# Patient Record
Sex: Female | Born: 1965 | Race: White | Hispanic: No | Marital: Married | Smoking: Former smoker
Health system: Southern US, Community
[De-identification: ages and names within clinical notes are randomized; demographics above are authoritative.]

## PROBLEM LIST (undated history)

## (undated) DIAGNOSIS — F329 Major depressive disorder, single episode, unspecified: Secondary | ICD-10-CM

## (undated) DIAGNOSIS — Z9889 Other specified postprocedural states: Secondary | ICD-10-CM

## (undated) DIAGNOSIS — T4145XA Adverse effect of unspecified anesthetic, initial encounter: Secondary | ICD-10-CM

## (undated) DIAGNOSIS — F32A Depression, unspecified: Secondary | ICD-10-CM

## (undated) DIAGNOSIS — R112 Nausea with vomiting, unspecified: Secondary | ICD-10-CM

## (undated) DIAGNOSIS — T8859XA Other complications of anesthesia, initial encounter: Secondary | ICD-10-CM

## (undated) HISTORY — PX: DIAGNOSTIC LAPAROSCOPY: SUR761

---

## 1987-10-03 HISTORY — PX: WISDOM TOOTH EXTRACTION: SHX21

## 2004-12-08 ENCOUNTER — Ambulatory Visit: Payer: Self-pay

## 2006-11-08 ENCOUNTER — Ambulatory Visit: Payer: Self-pay

## 2007-12-10 ENCOUNTER — Ambulatory Visit: Payer: Self-pay

## 2008-12-14 ENCOUNTER — Ambulatory Visit: Payer: Self-pay

## 2009-12-16 ENCOUNTER — Ambulatory Visit: Payer: Self-pay

## 2010-12-19 ENCOUNTER — Ambulatory Visit: Payer: Self-pay

## 2012-01-04 ENCOUNTER — Ambulatory Visit: Payer: Self-pay

## 2013-01-06 ENCOUNTER — Ambulatory Visit: Payer: Self-pay

## 2015-11-10 NOTE — H&P (Signed)
Erica Cortez is a 50 y.o. female here for Pre Op Consulting  AUB- 2 months of heavy daily bleeding with significant cramping. Not interested in hormonal control or ablation at this time. No EMBx completed.  Normal ultrasound with 13mm stripe.   Past Medical History:  has a past medical history of Depression, unspecified.  Past Surgical History:  has no past surgical history on file. Family History: family history includes Diabetes mellitus in her mother; Heart disease in her father. Social History:  reports that she has never smoked. She does not have any smokeless tobacco history on file. She reports that she does not drink alcohol. OB/GYN History:  OB History    Gravida Para Term Preterm AB TAB SAB Ectopic Multiple Living   Allergies: has No Known Allergies. Medications:  Current Outpatient Prescriptions:  . sertraline (ZOLOFT) 50 MG tablet, Take 1 tablet (50 mg total) by mouth once daily., Disp: 30 tablet, Rfl: 12   Review of Systems: General:   No fatigue or weight loss Eyes:   No vision changes Ears:   No hearing difficulty Respiratory:   No cough or shortness of breath Pulmonary:   No asthma or shortness of breath Cardiovascular:  No chest pain, palpitations, dyspnea on exertion Gastrointestinal:  No abdominal bloating, chronic diarrhea, constipations, masses, pain or hematochezia Genitourinary:  No hematuria, dysuria, abnormal vaginal discharge, pelvic pain, +Menometrorrhagia  Lymphatic:  No swollen lymph nodes Musculoskeletal: No muscle weakness Neurologic:  No extremity weakness, syncope, seizure disorder Psychiatric:  No history of depression, delusions or suicidal/homicidal ideation   Exam:      Vitals:   11/10/15 1013  BP: 113/69  Pulse: 97   Body mass index is 25.44 kg/(m^2).  WDWN white female in NAD Lungs: CTA  CV : RRR without murmur  Breast: deferred Neck: no thyromegaly Abdomen: soft , no mass, normal  active bowel sounds, non-tender, no rebound tenderness Skin: No rashes, ulcers or skin lesions noted. No excessive hirsutism or acne noted.  Neurological: Appears alert and oriented and is a good historian. No gross abnormalities are noted. Psychological: Normal affect and mood. No signs of anxiety or depression noted.   Pelvic:  External genitalia: vulva /labia no lesions, Tanner stage 5 Urethra: no prolapse Vagina: normal physiologic d/c Cervix: no lesions, no cervical motion tenderness  Uterus: normal size shape and contour, non-tender Adnexa: no mass, non-tender  Rectovaginal: external exam normal  Endometrial biopsy: The cervix was cleaned with betadine, topical Hurriciane spray applied, and a single tooth tenaculum is applied to the anterior cervix. The Pipelle catheter was placed into the endometrial cavity. It sounds to 7 cm and adequate tissue was removed.    Impression:   The encounter diagnosis was Abnormal uterine bleeding (AUB).    Plan:   Abnormal uterine bleeding with thickened endometrial stripe:   Preoperative visit: D&C hysteroscopy. Consents signed today. Risks of surgery were discussed with the patient including but not limited to: bleeding which may require transfusion; infection which may require antibiotics; injury to uterus or surrounding organs; intrauterine scarring which may impair future fertility; need for additional procedures including laparotomy or laparoscopy; and other postoperative/anesthesia complications. Written informed consent was obtained.  This is a scheduled same-day surgery. She will have a postop visit in 2 weeks to review operative findings and pathology.  She is aware that if uterine abnormalities found may need additional procedures.  25 min spent with  the patient, with >50% spent in counseling and clinical decision making

## 2015-11-11 ENCOUNTER — Encounter
Admission: RE | Admit: 2015-11-11 | Discharge: 2015-11-11 | Disposition: A | Discharge status: Home / Self Care | Payer: 59 | Source: Ambulatory Visit | Attending: Obstetrics and Gynecology | Admitting: Obstetrics and Gynecology

## 2015-11-11 DIAGNOSIS — Z01812 Encounter for preprocedural laboratory examination: Secondary | ICD-10-CM | POA: Insufficient documentation

## 2015-11-11 HISTORY — DX: Other specified postprocedural states: Z98.890

## 2015-11-11 HISTORY — DX: Nausea with vomiting, unspecified: R11.2

## 2015-11-11 HISTORY — DX: Depression, unspecified: F32.A

## 2015-11-11 HISTORY — DX: Major depressive disorder, single episode, unspecified: F32.9

## 2015-11-11 HISTORY — DX: Other complications of anesthesia, initial encounter: T88.59XA

## 2015-11-11 HISTORY — DX: Adverse effect of unspecified anesthetic, initial encounter: T41.45XA

## 2015-11-11 LAB — CBC
HEMATOCRIT: 36.8 % (ref 35.0–47.0)
HEMOGLOBIN: 12.4 g/dL (ref 12.0–16.0)
MCH: 30.6 pg (ref 26.0–34.0)
MCHC: 33.6 g/dL (ref 32.0–36.0)
MCV: 91 fL (ref 80.0–100.0)
Platelets: 299 10*3/uL (ref 150–440)
RBC: 4.04 MIL/uL (ref 3.80–5.20)
RDW: 13.7 % (ref 11.5–14.5)
WBC: 8.1 10*3/uL (ref 3.6–11.0)

## 2015-11-11 LAB — BASIC METABOLIC PANEL
Anion gap: 4 — ABNORMAL LOW (ref 5–15)
BUN: 13 mg/dL (ref 6–20)
CHLORIDE: 108 mmol/L (ref 101–111)
CO2: 27 mmol/L (ref 22–32)
Calcium: 8.8 mg/dL — ABNORMAL LOW (ref 8.9–10.3)
Creatinine, Ser: 0.67 mg/dL (ref 0.44–1.00)
GFR calc non Af Amer: >60 mL/min (ref >60)
Glucose, Bld: 85 mg/dL (ref 65–99)
Potassium: 3.6 mmol/L (ref 3.5–5.1)
SODIUM: 139 mmol/L (ref 135–145)

## 2015-11-11 LAB — TYPE AND SCREEN
ABO/RH(D): A POS
Antibody Screen: NEGATIVE

## 2015-11-11 LAB — ABO/RH: ABO/RH(D): A POS

## 2015-11-11 NOTE — Patient Instructions (Signed)
  Your procedure is scheduled on: Monday Feb. 13, 2016. Report to Same Day Surgery. To find out your arrival time please call [phone] between 1PM - 3PM on Friday Feb. 10, 2017.  Remember: Instructions that are not followed completely may result in serious medical risk, up to and including death, or upon the discretion of your surgeon and anesthesiologist your surgery may need to be rescheduled.    _x___ 1. Do not eat food or drink liquids after midnight. No gum chewing or hard candies.     ____ 2. No Alcohol for 24 hours before or after surgery.   ____ 3. Bring all medications with you on the day of surgery if instructed.    __x__ 4. Notify your doctor if there is any change in your medical condition     (cold, fever, infections).     Do not wear jewelry, make-up, hairpins, clips or nail polish.  Do not wear lotions, powders, or perfumes. You may wear deodorant.  Do not shave 48 hours prior to surgery. Men may shave face and neck.  Do not bring valuables to the hospital.    [facility] is not responsible for any belongings or valuables.               Contacts, dentures or bridgework may not be worn into surgery.  Leave your suitcase in the car. After surgery it may be brought to your room.  For patients admitted to the hospital, discharge time is determined by your treatment team.   Patients discharged the day of surgery will not be allowed to drive home.    Please read over the following fact sheets that you were given:   [facility] Preparing for Surgery  __x__ Take these medicines the morning of surgery with A SIP OF WATER:    1. sertraline (ZOLOFT)     ____ Fleet Enema (as directed)   ____ Use CHG Soap as directed  ____ Use inhalers on the day of surgery  ____ Stop metformin 2 days prior to surgery    ____ Take 1/2 of usual insulin dose the night before surgery and none on the morning of surgery.   ____ Stop Coumadin/Plavix/aspirin on does not apply.  __x__  Stop Anti-inflammatories now.  Tylenol is ok for pain   ____ Stop supplements until after surgery.    ____ Bring C-Pap to the hospital.

## 2015-11-15 ENCOUNTER — Ambulatory Visit
Admission: RE | Admit: 2015-11-15 | Discharge: 2015-11-15 | Disposition: A | Discharge status: Home / Self Care | Payer: Commercial Managed Care - HMO | Source: Ambulatory Visit | Attending: Obstetrics and Gynecology | Admitting: Obstetrics and Gynecology

## 2015-11-15 ENCOUNTER — Ambulatory Visit: Payer: Commercial Managed Care - HMO | Admitting: Anesthesiology

## 2015-11-15 ENCOUNTER — Encounter
Admission: RE | Disposition: A | Discharge status: Home / Self Care | Payer: Self-pay | Source: Ambulatory Visit | Attending: Obstetrics and Gynecology

## 2015-11-15 DIAGNOSIS — Z79899 Other long term (current) drug therapy: Secondary | ICD-10-CM | POA: Insufficient documentation

## 2015-11-15 DIAGNOSIS — N840 Polyp of corpus uteri: Secondary | ICD-10-CM | POA: Insufficient documentation

## 2015-11-15 DIAGNOSIS — Z8249 Family history of ischemic heart disease and other diseases of the circulatory system: Secondary | ICD-10-CM | POA: Insufficient documentation

## 2015-11-15 DIAGNOSIS — N879 Dysplasia of cervix uteri, unspecified: Secondary | ICD-10-CM | POA: Insufficient documentation

## 2015-11-15 DIAGNOSIS — N8500 Endometrial hyperplasia, unspecified: Secondary | ICD-10-CM | POA: Diagnosis not present

## 2015-11-15 DIAGNOSIS — Z833 Family history of diabetes mellitus: Secondary | ICD-10-CM | POA: Insufficient documentation

## 2015-11-15 DIAGNOSIS — N939 Abnormal uterine and vaginal bleeding, unspecified: Secondary | ICD-10-CM | POA: Insufficient documentation

## 2015-11-15 DIAGNOSIS — F329 Major depressive disorder, single episode, unspecified: Secondary | ICD-10-CM | POA: Insufficient documentation

## 2015-11-15 HISTORY — PX: HYSTEROSCOPY WITH D & C: SHX1775

## 2015-11-15 LAB — POCT PREGNANCY, URINE: PREG TEST UR: NEGATIVE

## 2015-11-15 SURGERY — DILATATION AND CURETTAGE /HYSTEROSCOPY
Anesthesia: General | Site: Vagina | Wound class: Clean Contaminated

## 2015-11-15 MED ORDER — DOCUSATE SODIUM 100 MG PO CAPS: 100.0000 mg | ORAL_CAPSULE | Freq: Two times a day (BID) | ORAL | Status: DC | PRN

## 2015-11-15 MED ORDER — DEXAMETHASONE SODIUM PHOSPHATE 10 MG/ML IJ SOLN
INTRAMUSCULAR | Status: DC | PRN
  Administered 2015-11-15: 10 mg via INTRAVENOUS

## 2015-11-15 MED ORDER — LACTATED RINGERS IV SOLN
INTRAVENOUS | Status: DC
  Administered 2015-11-15: 13:00:00 via INTRAVENOUS

## 2015-11-15 MED ORDER — MIDAZOLAM HCL 2 MG/2ML IJ SOLN
INTRAMUSCULAR | Status: DC | PRN
  Administered 2015-11-15: 2 mg via INTRAVENOUS

## 2015-11-15 MED ORDER — FAMOTIDINE 20 MG PO TABS
20.0000 mg | ORAL_TABLET | Freq: Once | ORAL | Status: AC
  Administered 2015-11-15: 20 mg via ORAL

## 2015-11-15 MED ORDER — SILVER NITRATE-POT NITRATE 75-25 % EX MISC
CUTANEOUS | Status: AC
  Filled 2015-11-15: qty 4

## 2015-11-15 MED ORDER — IBUPROFEN 600 MG PO TABS
600.0000 mg | ORAL_TABLET | Freq: Four times a day (QID) | ORAL | Status: DC | PRN
Start: 2015-11-15 — End: 2020-10-18

## 2015-11-15 MED ORDER — PROPOFOL 10 MG/ML IV BOLUS: INTRAVENOUS | Status: DC | PRN

## 2015-11-15 MED ORDER — FAMOTIDINE 20 MG PO TABS
ORAL_TABLET | ORAL | Status: AC
  Administered 2015-11-15: 20 mg via ORAL
  Filled 2015-11-15: qty 1

## 2015-11-15 MED ORDER — LACTATED RINGERS IV SOLN
INTRAVENOUS | Status: DC
  Administered 2015-11-15: 10:00:00 via INTRAVENOUS

## 2015-11-15 MED ORDER — LIDOCAINE HCL (CARDIAC) 20 MG/ML IV SOLN
INTRAVENOUS | Status: DC | PRN
  Administered 2015-11-15: 40 mg via INTRAVENOUS

## 2015-11-15 MED ORDER — ONDANSETRON HCL 4 MG/2ML IJ SOLN
4.0000 mg | Freq: Once | INTRAMUSCULAR | Status: AC | PRN
  Administered 2015-11-15: 4 mg via INTRAVENOUS

## 2015-11-15 MED ORDER — FENTANYL CITRATE (PF) 100 MCG/2ML IJ SOLN
INTRAMUSCULAR | Status: AC
  Administered 2015-11-15: 25 ug via INTRAVENOUS
  Filled 2015-11-15: qty 2

## 2015-11-15 MED ORDER — FENTANYL CITRATE (PF) 100 MCG/2ML IJ SOLN
INTRAMUSCULAR | Status: DC | PRN
  Administered 2015-11-15: 50 ug via INTRAVENOUS

## 2015-11-15 MED ORDER — PROPOFOL 10 MG/ML IV BOLUS
INTRAVENOUS | Status: DC | PRN
  Administered 2015-11-15: 150 mg via INTRAVENOUS

## 2015-11-15 MED ORDER — FENTANYL CITRATE (PF) 100 MCG/2ML IJ SOLN
25.0000 ug | INTRAMUSCULAR | Status: DC | PRN
  Administered 2015-11-15 (×4): 25 ug via INTRAVENOUS

## 2015-11-15 SURGICAL SUPPLY — 17 items
CANISTER SUCT 3000ML (MISCELLANEOUS) ×2 IMPLANT
CATH ROBINSON RED A/P 16FR (CATHETERS) ×2 IMPLANT
CORD URO TURP 10FT (MISCELLANEOUS) IMPLANT
ELECT LOOP MED HF 24F 12D (CUTTING LOOP) IMPLANT
ELECT REM PT RETURN 9FT ADLT (ELECTROSURGICAL)
ELECT RESECT POWERBALL 24F (MISCELLANEOUS) IMPLANT
ELECTRODE REM PT RTRN 9FT ADLT (ELECTROSURGICAL) IMPLANT
GLOVE BIO SURGEON STRL SZ 6.5 (GLOVE) ×2 IMPLANT
GLOVE INDICATOR 7.0 STRL GRN (GLOVE) ×2 IMPLANT
GOWN STRL REUS W/ TWL LRG LVL3 (GOWN DISPOSABLE) ×2 IMPLANT
GOWN STRL REUS W/TWL LRG LVL3 (GOWN DISPOSABLE) ×2
IV LACTATED RINGERS 1000ML (IV SOLUTION) ×2 IMPLANT
KIT RM TURNOVER CYSTO AR (KITS) ×2 IMPLANT
PACK DNC HYST (MISCELLANEOUS) ×2 IMPLANT
PAD OB MATERNITY 4.3X12.25 (PERSONAL CARE ITEMS) ×2 IMPLANT
PAD PREP 24X41 OB/GYN DISP (PERSONAL CARE ITEMS) ×2 IMPLANT
TUBING CONNECTING 10 (TUBING) ×2 IMPLANT

## 2015-11-15 NOTE — Anesthesia Preprocedure Evaluation (Addendum)
Anesthesia Evaluation  Patient identified by MRN, date of birth, ID band Patient awake    Reviewed: Allergy & Precautions, H&P , NPO status , Patient's Chart, lab work & pertinent test results, reviewed documented beta blocker date and time   History of Anesthesia Complications (+) PONV and history of anesthetic complications  Airway Mallampati: II  TM Distance: >3 FB Neck ROM: full    Dental  (+) Teeth Intact,    Pulmonary neg pulmonary ROS, former smoker,    Pulmonary exam normal        Cardiovascular Exercise Tolerance: Good negative cardio ROS Normal cardiovascular exam Rhythm:Regular Rate:Normal     Neuro/Psych PSYCHIATRIC DISORDERS negative neurological ROS  negative psych ROS   GI/Hepatic negative GI ROS, Neg liver ROS,   Endo/Other  negative endocrine ROS  Renal/GU negative Renal ROS  negative genitourinary   Musculoskeletal   Abdominal   Peds  Hematology negative hematology ROS (+)   Anesthesia Other Findings   Reproductive/Obstetrics negative OB ROS                            Anesthesia Physical Anesthesia Plan  ASA: II  Anesthesia Plan: General LMA   Post-op Pain Management:    Induction:   Airway Management Planned:   Additional Equipment:   Intra-op Plan:   Post-operative Plan:   Informed Consent: I have reviewed the patients History and Physical, chart, labs and discussed the procedure including the risks, benefits and alternatives for the proposed anesthesia with the patient or authorized representative who has indicated his/her understanding and acceptance.     Plan Discussed with: CRNA  Anesthesia Plan Comments:         Anesthesia Quick Evaluation

## 2015-11-15 NOTE — Anesthesia Postprocedure Evaluation (Signed)
Anesthesia Post Note  Patient: Candiss Norse  Procedure(s) Performed: Procedure(s) (LRB): DILATATION AND CURETTAGE /HYSTEROSCOPY (N/A)  Patient location during evaluation: PACU Anesthesia Type: General Level of consciousness: awake and alert Pain management: pain level controlled Vital Signs Assessment: post-procedure vital signs reviewed and stable Respiratory status: spontaneous breathing, nonlabored ventilation, respiratory function stable and patient connected to nasal cannula oxygen Cardiovascular status: blood pressure returned to baseline and stable Postop Assessment: no signs of nausea or vomiting Anesthetic complications: no    Last Vitals:  Filed Vitals:   11/15/15 1453 11/15/15 1500  BP:  118/73  Pulse: 73 63  Temp: 36.4 C 35.8 C  Resp: 16 16    Last Pain:  Filed Vitals:   11/15/15 1502  PainSc: 1                  Yevette Edwards

## 2015-11-15 NOTE — Transfer of Care (Signed)
Immediate Anesthesia Transfer of Care Note  Patient: Erica Cortez  Procedure(s) Performed: Procedure(s): DILATATION AND CURETTAGE /HYSTEROSCOPY (N/A)  Patient Location: PACU  Anesthesia Type:General  Level of Consciousness: sedated  Airway & Oxygen Therapy: Patient Spontanous Breathing and Patient connected to face mask oxygen  Post-op Assessment: Report given to RN and Post -op Vital signs reviewed and stable  Post vital signs: Reviewed and stable  Last Vitals:  Filed Vitals:   11/15/15 1010 11/15/15 1410  BP: 115/78 125/78  Pulse: 64 58  Temp: 36.1 C 36.5 C  Resp: 14 11    Complications: No apparent anesthesia complications

## 2015-11-15 NOTE — Anesthesia Procedure Notes (Signed)
Procedure Name: LMA Insertion Date/Time: 11/15/2015 1:45 PM Performed by: Henrietta Hoover Pre-anesthesia Checklist: Patient identified, Emergency Drugs available, Suction available, Patient being monitored and Timeout performed Patient Re-evaluated:Patient Re-evaluated prior to inductionOxygen Delivery Method: Circle system utilized Preoxygenation: Pre-oxygenation with 100% oxygen Intubation Type: IV induction LMA: LMA inserted LMA Size: 4.0 Number of attempts: 1 Tube secured with: Tape Dental Injury: Teeth and Oropharynx as per pre-operative assessment

## 2015-11-15 NOTE — Interval H&P Note (Signed)
History and Physical Interval Note:  11/15/2015 12:59 PM  Erica Cortez  has presented today for surgery, with the diagnosis of AUB  The various methods of treatment have been discussed with the patient and family. After consideration of risks, benefits and other options for treatment, the patient has consented to  Procedure(s): DILATATION AND CURETTAGE /HYSTEROSCOPY (N/A) as a surgical intervention .  The patient's history has been reviewed, patient examined, no change in status, stable for surgery.  I have reviewed the patient's chart and labs.  Questions were answered to the patient's satisfaction.     Christeen Douglas

## 2015-11-15 NOTE — Discharge Instructions (Signed)
Discharge instructions after a hysteroscopy with dilation and curettage  Signs and Symptoms to Report  Call our office at [phone] if you have any of the following:   . Fever over 100.4 degrees or higher . Severe stomach pain not relieved with pain medications . Bright red bleeding that's heavier than a period that does not slow with rest after the first 24 hours . To go the bathroom a lot (frequency), you can't hold your urine (urgency), or it hurts when you empty your bladder (urinate) . Chest pain . Shortness of breath . Pain in the calves of your legs . Severe nausea and vomiting not relieved with anti-nausea medications . Any concerns  What You Can Expect after Surgery . You may see some pink tinged, bloody fluid. This is normal. You may also have cramping for several days.   Activities after Your Discharge Follow these guidelines to help speed your recovery at home: . Don't drive if you are in pain or taking narcotic pain medicine. You may drive when you can safely slam on the brakes, turn the wheel forcefully, and rotate your torso comfortably. This is typically 4-7 days. Practice in a parking lot or side street prior to attempting to drive regularly.  . Ask others to help with household chores for 4 weeks. . Don't do strenuous activities, exercises, or sports like vacuuming, tennis, squash, etc. until your doctor says it is safe to do so. . Walk as you feel able. Rest often since it may take a week or two for your energy level to return to normal.  . You may climb stairs . Avoid constipation:   -Eat fruits, vegetables, and whole grains. Eat small meals as your appetite will take time to return to normal.   -Drink 6 to 8 glasses of water each day unless your doctor has told you to limit your fluids.   -Use a laxative or stool softener as needed if constipation becomes a problem. You may take Miralax, metamucil, Citrucil, Colace, Senekot, FiberCon, etc. If this does not  relieve the constipation, try two tablespoons of Milk Of Magnesia every 8 hours until your bowels move.  . You may shower.  . Do not get in a hot tub, swimming pool, etc. until your doctor agrees. . Do not douche, use tampons, or have sex until your doctor says it is okay, usually about 2 weeks. . Take your pain medicine when you need it. The medicine may not work as well if the pain is bad.  Take the medicines you were taking before surgery. Other medications you might need are pain medications (ibuprofen), medications for constipation (Colace) and nausea medications (Zofran).        AMBULATORY SURGERY  DISCHARGE INSTRUCTIONS   1) The drugs that you were given will stay in your system until tomorrow so for the next 24 hours you should not:  A) Drive an automobile B) Make any legal decisions C) Drink any alcoholic beverage   2) You may resume regular meals tomorrow.  Today it is better to start with liquids and gradually work up to solid foods.  You may eat anything you prefer, but it is better to start with liquids, then soup and crackers, and gradually work up to solid foods.   3) Please notify your doctor immediately if you have any unusual bleeding, trouble breathing, redness and pain at the surgery site, drainage, fever, or pain not relieved by medication.    4) Additional Instructions:          Please contact your physician with any problems or Same Day Surgery at [phone], Monday through Friday 6 am to 4 pm, or [place] [facility] number at [phone]. 

## 2015-11-15 NOTE — Op Note (Signed)
Operative Report Hysteroscopy with Dilation and Curettage   Indications: Abnormal uterine bleeding   Pre-operative Diagnosis: AUB   Post-operative Diagnosis: same plust endometrial polyp  Procedure: 1. Exam under anesthesia 2. D&C 3. Hysteroscopy  Surgeon: Christeen Douglas, MD  Assistant(s):  None  Anesthesia: General endotracheal anesthesia  Estimated Blood Loss:  Minimal         Intraoperative medications: none         Total IV Fluids:  Urine Output: 50ml         Specimens: Endocervical curettings, endometrial curettings         Complications:  None; patient tolerated the procedure well.         Disposition: PACU - hemodynamically stable.         Condition: stable  Findings: Uterus measuring 8 cm by sound; normal cervix, vagina, perineum.   Indication for procedure/Consents: 50 y.o. F  here for scheduled surgery for the aforementioned diagnoses.   Risks of surgery were discussed with the patient including but not limited to: bleeding which may require transfusion; infection which may require antibiotics; injury to uterus or surrounding organs; intrauterine scarring which may impair future fertility; need for additional procedures including laparotomy or laparoscopy; and other postoperative/anesthesia complications. Written informed consent was obtained.    Procedure Details:   The patient was taken to the operating room where general anesthesia was administered and was found to be adequate. After a formal and adequate timeout was performed, she was placed in the dorsal lithotomy position and examined with the above findings. She was then prepped and draped in the sterile manner. Her bladder was catheterized for an estimated amount of clear, yellow urine. A weighed speculum was then placed in the patient's vagina and a single tooth tenaculum was applied to the anterior lip of the cervix.  Her cervix was serially dilated to 15 [place] using Hanks dilators. An ECC  was performed. The hysteroscope was introduced to reveal the above findings. A sharp curettage was then performed until there was a gritty texture in all four quadrants. The tenaculum was removed from the anterior lip of the cervix and the vaginal speculum was removed after applying silver nitrate for good hemostasis. The patient tolerated the procedure well and was taken to the recovery area awake and in stable condition. She received iv acetaminophen and Toradol prior to leaving the OR.  The patient will be discharged to home as per PACU criteria. Routine postoperative instructions given. She was prescribed Ibuprofen and Colace. She will follow up in the clinic in two weeks for postoperative evaluation.

## 2015-11-16 ENCOUNTER — Encounter: Payer: Self-pay | Admitting: Obstetrics and Gynecology

## 2015-11-17 LAB — SURGICAL PATHOLOGY

## 2016-03-20 ENCOUNTER — Other Ambulatory Visit: Payer: Self-pay | Admitting: Obstetrics and Gynecology

## 2016-03-20 DIAGNOSIS — Z1231 Encounter for screening mammogram for malignant neoplasm of breast: Secondary | ICD-10-CM

## 2016-03-31 ENCOUNTER — Other Ambulatory Visit: Payer: Self-pay | Admitting: Obstetrics and Gynecology

## 2016-03-31 ENCOUNTER — Ambulatory Visit
Admission: RE | Admit: 2016-03-31 | Discharge: 2016-03-31 | Disposition: A | Discharge status: Home / Self Care | Payer: 59 | Source: Ambulatory Visit | Attending: Obstetrics and Gynecology | Admitting: Obstetrics and Gynecology

## 2016-03-31 DIAGNOSIS — Z1231 Encounter for screening mammogram for malignant neoplasm of breast: Secondary | ICD-10-CM

## 2018-02-20 ENCOUNTER — Other Ambulatory Visit: Payer: Self-pay | Admitting: Nurse Practitioner

## 2018-02-20 DIAGNOSIS — Z1231 Encounter for screening mammogram for malignant neoplasm of breast: Secondary | ICD-10-CM

## 2018-03-13 ENCOUNTER — Ambulatory Visit
Admission: RE | Admit: 2018-03-13 | Discharge: 2018-03-13 | Disposition: A | Discharge status: Home / Self Care | Payer: BLUE CROSS/BLUE SHIELD | Source: Ambulatory Visit | Attending: Nurse Practitioner | Admitting: Nurse Practitioner

## 2018-03-13 ENCOUNTER — Encounter: Payer: Self-pay | Admitting: Radiology

## 2018-03-13 DIAGNOSIS — Z1231 Encounter for screening mammogram for malignant neoplasm of breast: Secondary | ICD-10-CM | POA: Diagnosis not present

## 2019-02-27 ENCOUNTER — Other Ambulatory Visit: Payer: Self-pay | Admitting: Nurse Practitioner

## 2019-02-27 DIAGNOSIS — Z1231 Encounter for screening mammogram for malignant neoplasm of breast: Secondary | ICD-10-CM

## 2019-05-29 ENCOUNTER — Encounter: Payer: Self-pay | Admitting: Radiology

## 2019-05-29 ENCOUNTER — Ambulatory Visit
Admission: RE | Admit: 2019-05-29 | Discharge: 2019-05-29 | Disposition: A | Discharge status: Home / Self Care | Payer: BC Managed Care – PPO | Source: Ambulatory Visit | Attending: Nurse Practitioner | Admitting: Nurse Practitioner

## 2019-05-29 DIAGNOSIS — Z1231 Encounter for screening mammogram for malignant neoplasm of breast: Secondary | ICD-10-CM

## 2019-10-01 ENCOUNTER — Other Ambulatory Visit: Payer: Self-pay

## 2019-10-01 ENCOUNTER — Emergency Department
Admission: EM | Admit: 2019-10-01 | Discharge: 2019-10-01 | Disposition: A | Discharge status: Home / Self Care | Payer: BC Managed Care – PPO | Attending: Emergency Medicine | Admitting: Emergency Medicine

## 2019-10-01 ENCOUNTER — Emergency Department: Payer: BC Managed Care – PPO

## 2019-10-01 ENCOUNTER — Encounter: Payer: Self-pay | Admitting: Emergency Medicine

## 2019-10-01 DIAGNOSIS — R002 Palpitations: Secondary | ICD-10-CM | POA: Insufficient documentation

## 2019-10-01 DIAGNOSIS — Z87891 Personal history of nicotine dependence: Secondary | ICD-10-CM | POA: Insufficient documentation

## 2019-10-01 DIAGNOSIS — R000 Tachycardia, unspecified: Secondary | ICD-10-CM

## 2019-10-01 DIAGNOSIS — Z79899 Other long term (current) drug therapy: Secondary | ICD-10-CM | POA: Insufficient documentation

## 2019-10-01 LAB — BASIC METABOLIC PANEL
Anion gap: 12 (ref 5–15)
BUN: 15 mg/dL (ref 6–20)
CO2: 25 mmol/L (ref 22–32)
Calcium: 9.4 mg/dL (ref 8.9–10.3)
Chloride: 103 mmol/L (ref 98–111)
Creatinine, Ser: 0.67 mg/dL (ref 0.44–1.00)
GFR calc Af Amer: >60 mL/min (ref >60)
GFR calc non Af Amer: >60 mL/min (ref >60)
Glucose, Bld: 99 mg/dL (ref 70–99)
Potassium: 3.9 mmol/L (ref 3.5–5.1)
Sodium: 140 mmol/L (ref 135–145)

## 2019-10-01 LAB — CBC
HCT: 45.8 % (ref 36.0–46.0)
Hemoglobin: 15 g/dL (ref 12.0–15.0)
MCH: 30.1 pg (ref 26.0–34.0)
MCHC: 32.8 g/dL (ref 30.0–36.0)
MCV: 92 fL (ref 80.0–100.0)
Platelets: 341 10*3/uL (ref 150–400)
RBC: 4.98 MIL/uL (ref 3.87–5.11)
RDW: 12.1 % (ref 11.5–15.5)
WBC: 10.1 10*3/uL (ref 4.0–10.5)
nRBC: 0 % (ref 0.0–0.2)

## 2019-10-01 LAB — TROPONIN I (HIGH SENSITIVITY): Troponin I (High Sensitivity): <2 ng/L (ref <18)

## 2019-10-01 NOTE — Discharge Instructions (Addendum)
Please call the number provided for cardiology to arrange a follow-up appointment to discuss a possible Holter monitor.  Return to the emergency department for any significant palpitations any chest pain or shortness of breath, or any other symptom personally concerning to yourself.

## 2019-10-01 NOTE — ED Provider Notes (Signed)
[facility] Emergency Department Provider Note  Time seen: 7:51 PM  I have reviewed the triage vital signs and the nursing notes.   HISTORY  Chief Complaint Tachycardia   HPI Erica Cortez is a 53 y.o. female with no significant past medical history presents to the emergency department for palpitations and elevated blood pressure.  According to the patient she was at work today when her watch alerted her that her heart rate was around 130 bpm.  Patient states she was monitoring her heart rate via her apple watch and it was ranging between 130 and 150, states she was able to calm down somewhat and would drop as low as 100.  Patient denies feeling like her heart was racing, denies feeling any palpitations denies any chest pain or shortness of breath at any point.  Patient states she took her blood pressure because her heart rate was elevated and the blood pressure returned elevated as high as 155 systolic which concerned the patient enough to come to the emergency department for evaluation.  Patient denies any cough congestion fever or shortness of breath.  Patient denies any symptoms at this time, states she feels back to normal.   Past Medical History:  Diagnosis Date  . Complication of anesthesia   . Depression   . PONV (postoperative nausea and vomiting)     There are no problems to display for this patient.   Past Surgical History:  Procedure Laterality Date  . DIAGNOSTIC LAPAROSCOPY    . HYSTEROSCOPY WITH D & C N/A 11/15/2015   Procedure: DILATATION AND CURETTAGE /HYSTEROSCOPY;  Surgeon: Christeen Douglas, MD;  Location: [facility] ORS;  Service: Gynecology;  Laterality: N/A;  . WISDOM TOOTH EXTRACTION  1989    Prior to Admission medications   Medication Sig Start Date End Date Taking? Authorizing Provider  docusate sodium (COLACE) 100 MG capsule Take 1 capsule (100 mg total) by mouth 2 (two) times daily as needed. 11/15/15   Christeen Douglas, MD  ibuprofen  (ADVIL,MOTRIN) 600 MG tablet Take 1 tablet (600 mg total) by mouth every 6 (six) hours as needed. 11/15/15   Christeen Douglas, MD  sertraline (ZOLOFT) 50 MG tablet Take 50 mg by mouth every morning.    [provider]  tiZANidine (ZANAFLEX) 4 MG capsule Take 4 mg by mouth 3 (three) times daily as needed for muscle spasms.    [provider]    No Known Allergies  Family History  Problem Relation Age of Onset  . Breast cancer Neg Hx     Social History Social History   Tobacco Use  . Smoking status: Former Smoker    Quit date: 11/11/1995    Years since quitting: 23.9  . Smokeless tobacco: Never Used  Substance Use Topics  . Alcohol use: No  . Drug use: No    Review of Systems Constitutional: Negative for fever Cardiovascular: Negative for chest pain. Respiratory: Negative for shortness of breath. Gastrointestinal: Negative for abdominal pain Musculoskeletal: Negative for musculoskeletal complaints Neurological: Negative for headache All other ROS negative  ____________________________________________   PHYSICAL EXAM:  VITAL SIGNS: ED Triage Vitals  Enc Vitals Group     BP 10/01/19 1833 138/83     Pulse Rate 10/01/19 1833 100     Resp 10/01/19 1833 16     Temp 10/01/19 1833 97.8 F (36.6 C)     Temp Source 10/01/19 1833 Oral     SpO2 10/01/19 1833 100 %     Weight  10/01/19 1834 174 lb (78.9 kg)     Height 10/01/19 1834 5\' 7"  (1.702 m)     Head Circumference --      Peak Flow --      Pain Score 10/01/19 1834 0     Pain Loc --      Pain Edu? --      Excl. in [place]? --    Constitutional: Alert and oriented. Well appearing and in no distress. Eyes: Normal exam ENT      Head: Normocephalic and atraumatic.      Mouth/Throat: Mucous membranes are moist. Cardiovascular: Normal rate, regular rhythm. No murmur Respiratory: Normal respiratory effort without tachypnea nor retractions. Breath sounds are clear  Gastrointestinal: Soft and nontender. No  distention.  Musculoskeletal: Nontender with normal range of motion in all extremities. Neurologic:  Normal speech and language. No gross focal neurologic deficits Skin:  Skin is warm, dry and intact.  Psychiatric: Mood and affect are normal.  ____________________________________________    EKG  EKG viewed and interpreted by myself shows a normal sinus rhythm at 96 bpm with a narrow QRS, normal axis, normal intervals, no concerning ST changes.  ____________________________________________    RADIOLOGY  Chest x-ray clear  ____________________________________________   INITIAL IMPRESSION / ASSESSMENT AND PLAN / ED COURSE  Pertinent labs & imaging results that were available during my care of the patient were reviewed by me and considered in my medical decision making (see chart for details).   Patient presents to the emergency department for possible elevated heart rate although the patient denies any palpitations and did not manually check her pulse but states her watch was telling her that her pulse was ranging between 130 and 150.  Currently the patient is in a normal sinus rhythm around 90 to 95 bpm, no concerning findings on her EKG, normal physical exam normal heart sounds.  Patient's work-up today including labs is reassuring including a negative troponin and a clear chest x-ray.  I discussed with the patient follow-up with cardiology for a Holter monitor, also discussed my typical return precautions.  Patient agreeable to plan of care.  Erica Cortez was evaluated in Emergency Department on 10/01/2019 for the symptoms described in the history of present illness. She was evaluated in the context of the global COVID-19 pandemic, which necessitated consideration that the patient might be at risk for infection with the SARS-CoV-2 virus that causes COVID-19. Institutional protocols and algorithms that pertain to the evaluation of patients at risk for COVID-19 are in a state of rapid  change based on information released by regulatory bodies including the CDC and federal and state organizations. These policies and algorithms were followed during the patient's care in the ED.  ____________________________________________   FINAL CLINICAL IMPRESSION(S) / ED DIAGNOSES  Palpitations   Harvest Dark, MD 10/01/19 [phone]

## 2019-10-01 NOTE — ED Triage Notes (Signed)
PT in via POV, sent from [facility].  Reports new onset tachycardia while at work today, states her apple watch notified her of the heart rate, reports 130-150.  Ambulatory to triage, vitals WDL, NAD noted at this time.

## 2020-03-11 ENCOUNTER — Other Ambulatory Visit: Payer: Self-pay | Admitting: Nurse Practitioner

## 2020-03-11 DIAGNOSIS — Z1231 Encounter for screening mammogram for malignant neoplasm of breast: Secondary | ICD-10-CM

## 2020-10-05 IMAGING — MG DIGITAL SCREENING BILATERAL MAMMOGRAM WITH TOMO AND CAD
8 series · 9 of 24 positions shown · non-contrast
Comparison: Previous exam(s).

CLINICAL DATA: Screening.

EXAM:
DIGITAL SCREENING BILATERAL MAMMOGRAM WITH TOMO AND CAD

[R CC synth-2D]
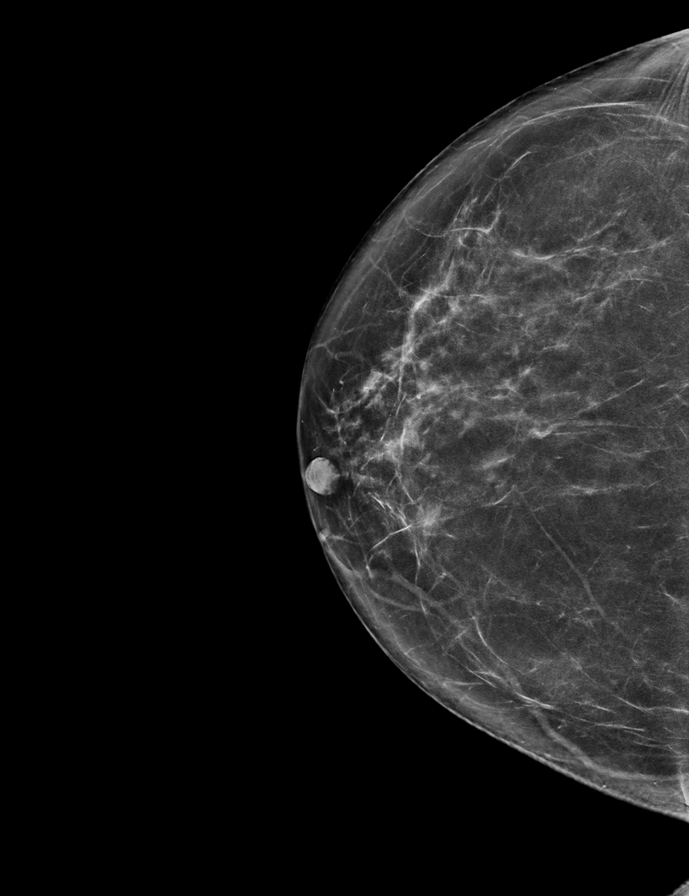

[L MLO synth-2D]
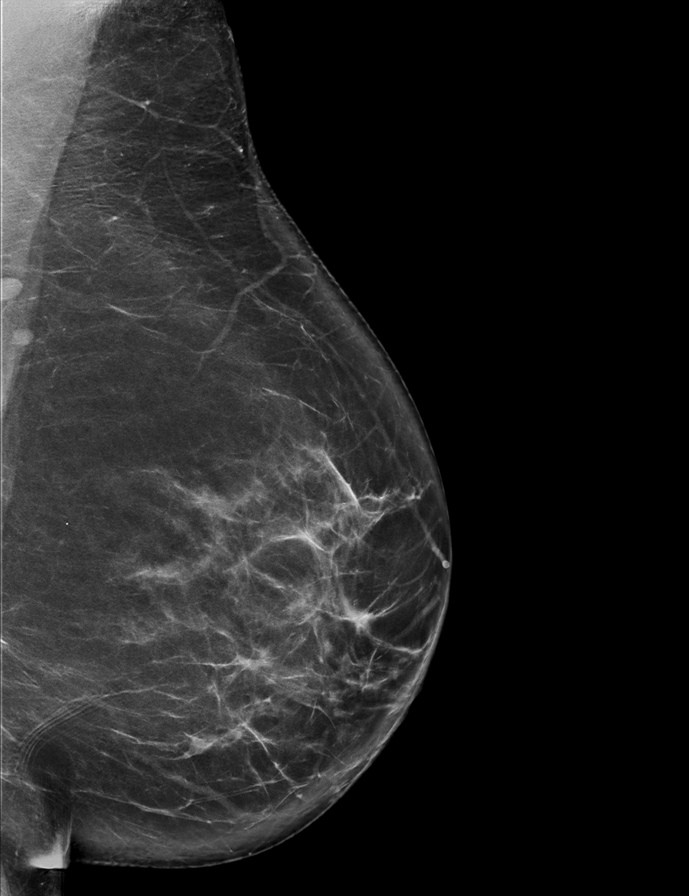

[R MLO synth-2D]
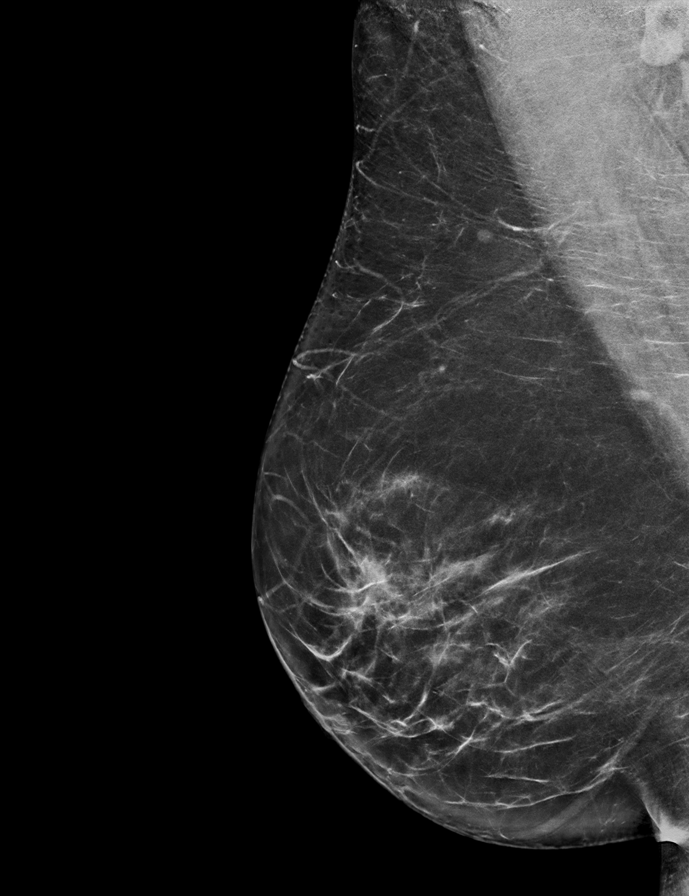

[L CC synth-2D]
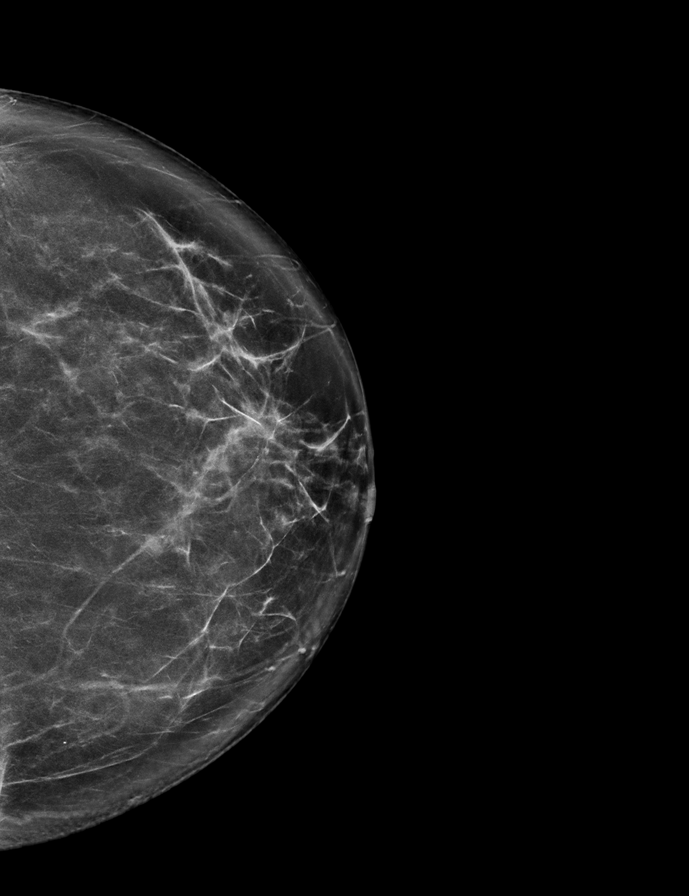

[L CC tomo · 2 of 78 frames shown]
[frame 26/78]
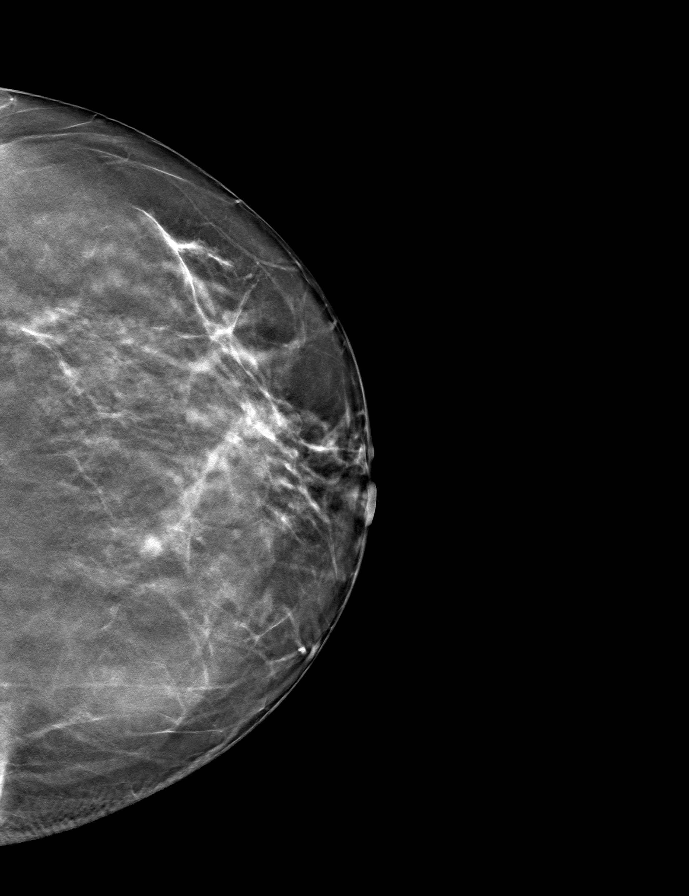
[frame 39/78]
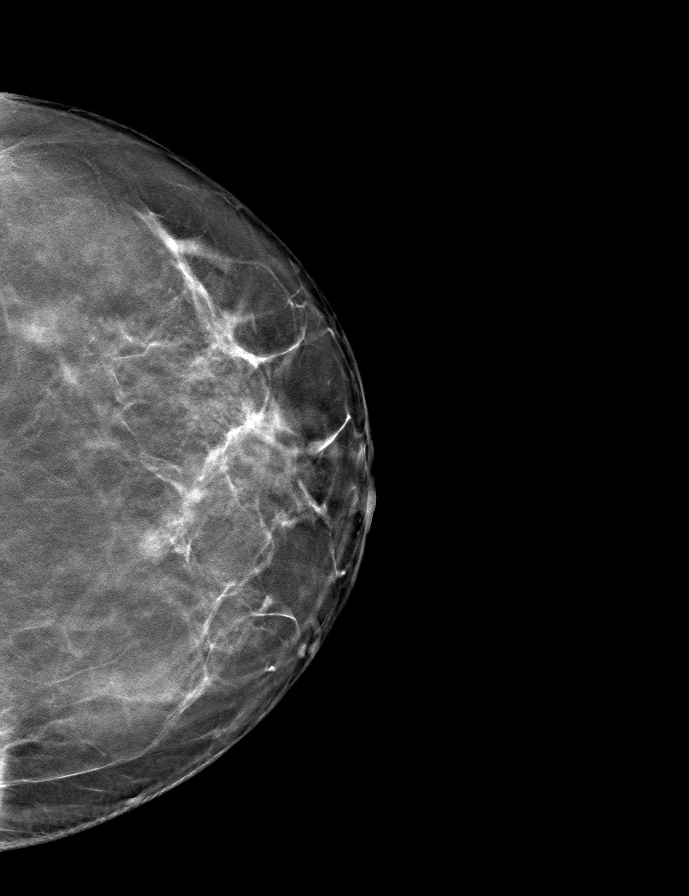

[L MLO tomo · tomo slice 43/84.0]
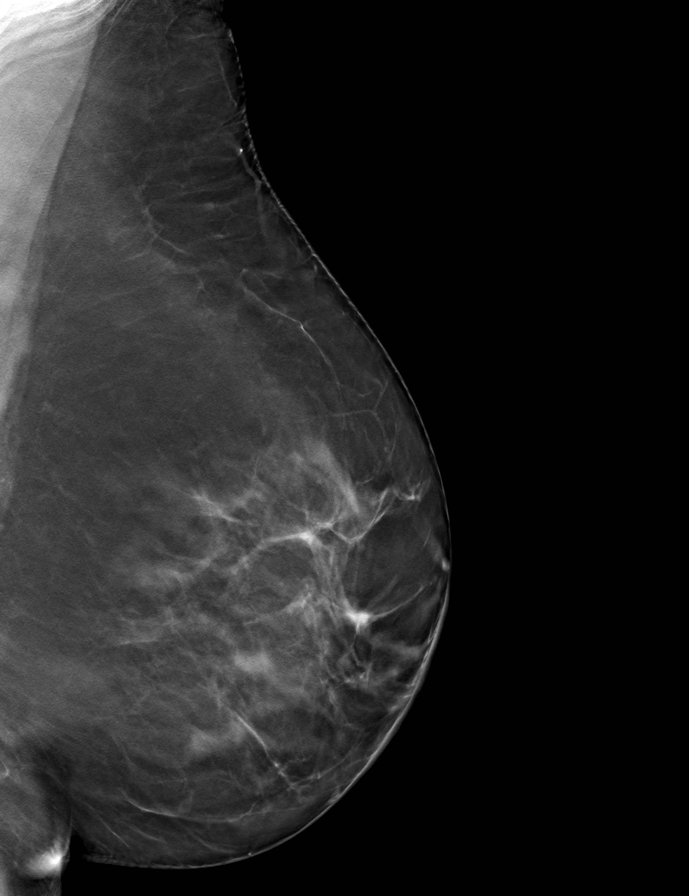

[R CC tomo · tomo slice 36/71.0]
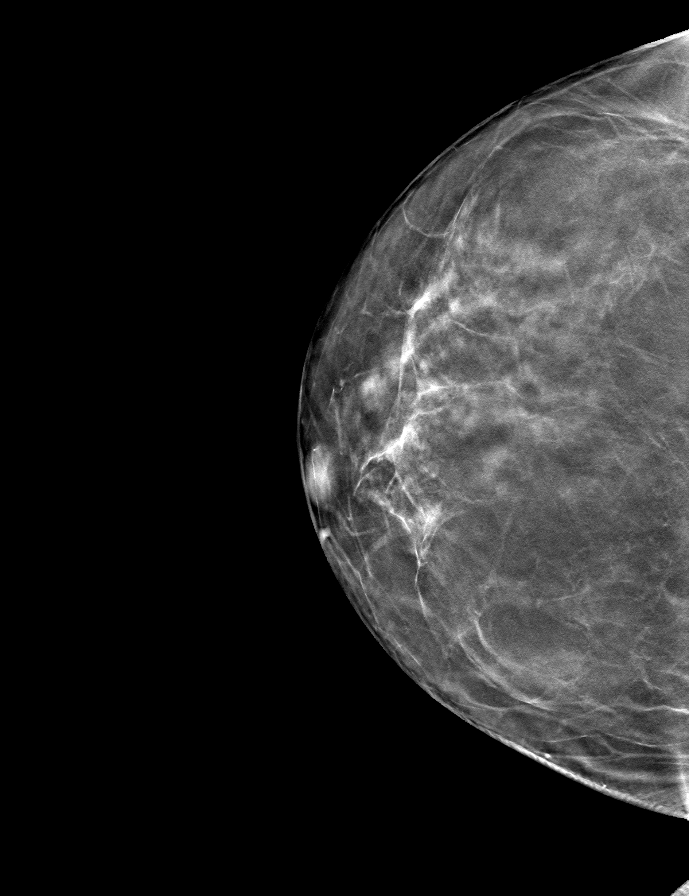

[R MLO tomo · tomo slice 40/79.0]
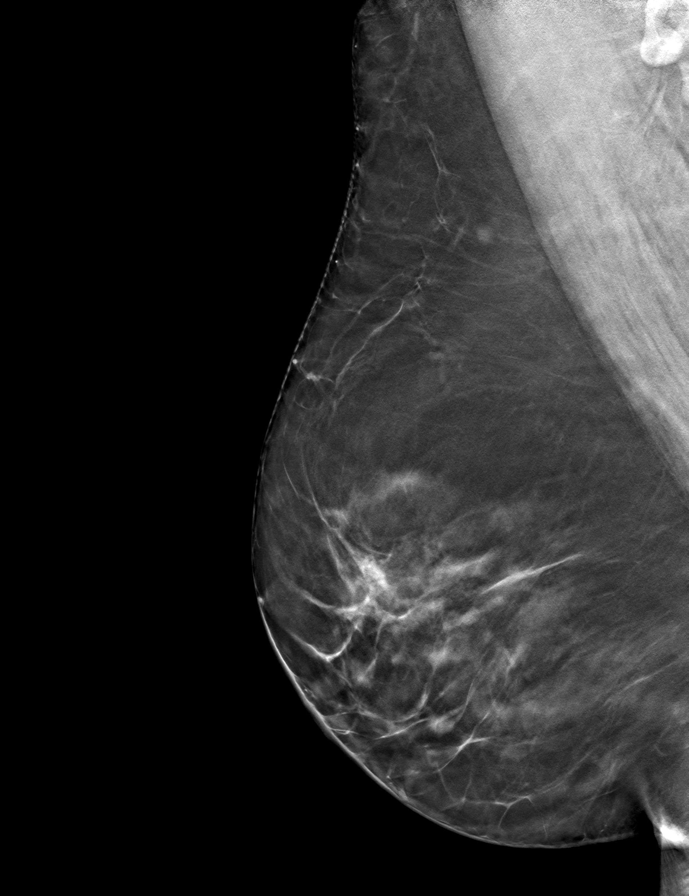

[9 of 24 positions shown; findings below may reference images not displayed]

ACR Breast Density Category b: There are scattered areas of
fibroglandular density.
FINDINGS: There are no findings suspicious for malignancy. Images were
processed with CAD.
IMPRESSION: No mammographic evidence of malignancy. A result letter of this
screening mammogram will be mailed directly to the patient.

RECOMMENDATION:
Screening mammogram in one year. (Code:CN-U-775)

BI-RADS CATEGORY  1: Negative.

## 2020-10-18 ENCOUNTER — Other Ambulatory Visit: Payer: Self-pay

## 2020-10-18 ENCOUNTER — Ambulatory Visit
Admission: EM | Admit: 2020-10-18 | Discharge: 2020-10-18 | Disposition: A | Discharge status: Home / Self Care | Payer: BC Managed Care – PPO | Attending: Internal Medicine | Admitting: Internal Medicine

## 2020-10-18 ENCOUNTER — Ambulatory Visit (INDEPENDENT_AMBULATORY_CARE_PROVIDER_SITE_OTHER): Payer: BC Managed Care – PPO

## 2020-10-18 DIAGNOSIS — S20212A Contusion of left front wall of thorax, initial encounter: Secondary | ICD-10-CM

## 2020-10-18 DIAGNOSIS — R0789 Other chest pain: Secondary | ICD-10-CM | POA: Diagnosis not present

## 2020-10-18 DIAGNOSIS — W19XXXA Unspecified fall, initial encounter: Secondary | ICD-10-CM

## 2020-10-18 MED ORDER — IBUPROFEN 800 MG PO TABS: 800.0000 mg | ORAL_TABLET | Freq: Three times a day (TID) | ORAL | 0 refills | Status: AC

## 2020-10-18 NOTE — ED Triage Notes (Signed)
Pt slipped on ice in parking lot at work and hit left ribcage/mid back

## 2020-10-18 NOTE — ED Provider Notes (Signed)
[facility]    CSN: [id number] Arrival date & time: 10/18/20  1307      History   Chief Complaint Chief Complaint  Patient presents with  . Fall    HPI Erica Cortez is a 55 y.o. female who presents with L rib pain and mid back pain since she fell on ice in the parking lot at work today and landed on concrete. Denies pain elsewhere.   Past Medical History:  Diagnosis Date  . Complication of anesthesia   . Depression   . PONV (postoperative nausea and vomiting)     There are no problems to display for this patient.   Past Surgical History:  Procedure Laterality Date  . DIAGNOSTIC LAPAROSCOPY    . HYSTEROSCOPY WITH D & C N/A 11/15/2015   Procedure: DILATATION AND CURETTAGE /HYSTEROSCOPY;  Surgeon: Christeen Douglas, MD;  Location: [facility];  Service: Gynecology;  Laterality: N/A;  . WISDOM TOOTH EXTRACTION  1989    OB History   No obstetric history on file.      Home Medications    Prior to Admission medications   Medication Sig Start Date End Date Taking? Authorizing Provider  ibuprofen (ADVIL) 800 MG tablet Take 1 tablet (800 mg total) by mouth 3 (three) times daily. 10/18/20  Yes Rodriguez-Southworth, Nettie Elm, PA-C  sertraline (ZOLOFT) 50 MG tablet Take 50 mg by mouth every morning.  10/18/20  [provider]    Family History Family History  Problem Relation Age of Onset  . Breast cancer Neg Hx     Social History Social History   Tobacco Use  . Smoking status: Former Smoker    Quit date: 11/11/1995    Years since quitting: 24.9  . Smokeless tobacco: Never Used  Vaping Use  . Vaping Use: Some days  Substance Use Topics  . Alcohol use: Yes    Comment: social  . Drug use: No     Allergies   Patient has no known allergies.   Review of Systems Review of Systems + for L mid back/rib pain, the rest is neg.   Physical Exam Triage Vital Signs ED Triage Vitals  Enc Vitals Group     BP 10/18/20 1354 132/86     Pulse Rate  10/18/20 1354 74     Resp 10/18/20 1354 17     Temp 10/18/20 1354 98.6 F (37 C)     Temp Source 10/18/20 1354 Oral     SpO2 10/18/20 1354 98 %     Weight 10/18/20 1356 181 lb (82.1 kg)     Height 10/18/20 1356 5\' 7"  (1.702 m)     Head Circumference --      Peak Flow --      Pain Score 10/18/20 1354 9     Pain Loc --      Pain Edu? --      Excl. in GC? --    No data found.  Updated Vital Signs BP 132/86 (BP Location: Left Arm)   Pulse 74   Temp 98.6 F (37 C) (Oral)   Resp 17   Ht 5\' 7"  (1.702 m)   Wt 181 lb (82.1 kg)   LMP 10/03/2015 Comment: abnormal bleeding  SpO2 98%   BMI 28.35 kg/m   Visual Acuity Right Eye Distance:   Left Eye Distance:   Bilateral Distance:    Right Eye Near:   Left Eye Near:    Bilateral Near:     Physical Exam Constitutional:  General: She is not in acute distress.    Appearance: She is obese. She is not toxic-appearing.  HENT:     Head: Normocephalic.     Right Ear: External ear normal.     Left Ear: External ear normal.  Eyes:     General: No scleral icterus.    Conjunctiva/sclera: Conjunctivae normal.  Pulmonary:     Effort: Pulmonary effort is normal.     Breath sounds: Normal breath sounds.     Comments: L lateral mid rib, right under her braw area is tender and same area on her L back. There is no crepitations or bruising or swelling noted.  Musculoskeletal:        General: Normal range of motion.     Cervical back: Neck supple.  Skin:    General: Skin is warm and dry.     Findings: No bruising, erythema or rash.  Neurological:     Mental Status: She is alert and oriented to person, place, and time.     Gait: Gait normal.  Psychiatric:        Mood and Affect: Mood normal.        Behavior: Behavior normal.        Thought Content: Thought content normal.        Judgment: Judgment normal.      UC Treatments / Results  Labs (all labs ordered are listed, but only abnormal results are displayed) Labs Reviewed -  No data to display  EKG   Radiology DG Ribs Unilateral W/Chest Left  Result Date: 10/18/2020 CLINICAL DATA:  Pain following fall EXAM: LEFT RIBS AND CHEST - 3+ VIEW COMPARISON:  Chest radiograph October 01, 2019 FINDINGS: Frontal chest as well as oblique and cone-down rib images were obtained. Lungs are clear. Heart size and pulmonary vascularity are normal. No adenopathy. No pneumothorax or pleural effusion evident. No demonstrable rib fracture. IMPRESSION: No evident rib fracture.  Lungs clear. Electronically Signed   By: Bretta Bang III M.D.   On: 10/18/2020 14:22    Procedures Procedures (including critical care time)  Medications Ordered in UC Medications - No data to display  Initial Impression / Assessment and Plan / UC Course  I have reviewed the triage vital signs and the nursing notes. Pertinent  imaging results that were available during my care of the patient were reviewed by me and considered in my medical decision making (see chart for details). Has L rib contusion. Placed on Ibuprofen 800 mg as noted. See instructions. She declined a note for work.  Final Clinical Impressions(s) / UC Diagnoses   Final diagnoses:  Fall, initial encounter  Contusion of rib on left side, initial encounter     Discharge Instructions     Apply ice on area of pain for 20 minutes every 2 hours today, and tomorrow try at least 4 times. After 48 hours you may apply heat. Take Ibuproren as needed for pain and inflammation.     ED Prescriptions    Medication Sig Dispense Auth. Provider   ibuprofen (ADVIL) 800 MG tablet Take 1 tablet (800 mg total) by mouth 3 (three) times daily. 21 tablet Rodriguez-Southworth, Nettie Elm, PA-C     PDMP not reviewed this encounter.   Garey Ham, PA-C 10/18/20 1457

## 2020-10-18 NOTE — Discharge Instructions (Addendum)
Apply ice on area of pain for 20 minutes every 2 hours today, and tomorrow try at least 4 times. After 48 hours you may apply heat. Take Ibuproren as needed for pain and inflammation.

## 2021-03-31 ENCOUNTER — Other Ambulatory Visit: Payer: Self-pay | Admitting: Nurse Practitioner

## 2021-03-31 DIAGNOSIS — Z1231 Encounter for screening mammogram for malignant neoplasm of breast: Secondary | ICD-10-CM

## 2021-05-03 ENCOUNTER — Ambulatory Visit
Admission: RE | Admit: 2021-05-03 | Discharge: 2021-05-03 | Disposition: A | Discharge status: Home / Self Care | Payer: BC Managed Care – PPO | Source: Ambulatory Visit | Attending: Nurse Practitioner | Admitting: Nurse Practitioner

## 2021-05-03 ENCOUNTER — Other Ambulatory Visit: Payer: Self-pay

## 2021-05-03 DIAGNOSIS — Z1231 Encounter for screening mammogram for malignant neoplasm of breast: Secondary | ICD-10-CM | POA: Insufficient documentation

## 2022-05-15 ENCOUNTER — Other Ambulatory Visit: Payer: Self-pay | Admitting: Nurse Practitioner

## 2022-05-15 DIAGNOSIS — Z1231 Encounter for screening mammogram for malignant neoplasm of breast: Secondary | ICD-10-CM

## 2023-12-24 ENCOUNTER — Other Ambulatory Visit: Payer: Self-pay | Admitting: Nurse Practitioner

## 2023-12-24 DIAGNOSIS — Z1231 Encounter for screening mammogram for malignant neoplasm of breast: Secondary | ICD-10-CM

## 2024-01-02 ENCOUNTER — Ambulatory Visit
Admission: RE | Admit: 2024-01-02 | Discharge: 2024-01-02 | Disposition: A | Discharge status: Home / Self Care | Source: Ambulatory Visit | Attending: Nurse Practitioner | Admitting: Nurse Practitioner

## 2024-01-02 DIAGNOSIS — Z1231 Encounter for screening mammogram for malignant neoplasm of breast: Secondary | ICD-10-CM | POA: Insufficient documentation

## 2024-08-22 NOTE — Progress Notes (Unsigned)
    Referring Physician:  Don Lauraine Collar, NP [address] [place],  [place] [address]  Primary Physician:  Don Lauraine Collar, NP  History of Present Illness: 08/22/2024 [name] is here today with a chief complaint of ***  Neuropathy of right peroneal nerve    PreCharting for nerve patients : EMG? - none Imaging of Nerve, Ultrasound or MRI? - none Prior Surgery?- none    Intake: How long?  Any prior treatment?  Review of Systems:  A 10 point review of systems is negative, except for the pertinent positives and negatives detailed in the HPI.  Past Medical History: Past Medical History:  Diagnosis Date   Complication of anesthesia    Depression    PONV (postoperative nausea and vomiting)     Past Surgical History: Past Surgical History:  Procedure Laterality Date   DIAGNOSTIC LAPAROSCOPY     HYSTEROSCOPY WITH D & C N/A 11/15/2015   Procedure: DILATATION AND CURETTAGE /HYSTEROSCOPY;  Surgeon: Heather Penton, MD;  Location: [facility];  Service: Gynecology;  Laterality: N/A;   WISDOM TOOTH EXTRACTION  1989    Allergies: Allergies as of 09/01/2024   (No Known Allergies)    Medications:  Current Outpatient Medications:    ibuprofen  (ADVIL ) 800 MG tablet, Take 1 tablet (800 mg total) by mouth 3 (three) times daily., Disp: 21 tablet, Rfl: 0  Social History: Social History   Tobacco Use   Smoking status: Former    Current packs/day: 0.00    Types: Cigarettes    Quit date: 11/11/1995    Years since quitting: 28.8   Smokeless tobacco: Never  Vaping Use   Vaping status: Some Days  Substance Use Topics   Alcohol use: Yes    Comment: social   Drug use: No    Family Medical History: Family History  Problem Relation Age of Onset   Breast cancer Neg Hx     Physical Examination: There were no vitals filed for this visit.  General: Patient is in no apparent distress. Attention to examination is appropriate.  Neck:   Supple.  Full range  of motion.  Respiratory: Patient is breathing without any difficulty.   NEUROLOGICAL:     Awake, alert, oriented to person, place, and time.  Speech is clear and fluent.   Cranial Nerves: Pupils equal round and reactive to light.  Facial tone is symmetric. Shoulder shrug is symmetric. Tongue protrusion is midline.  There is no pronator drift.  Motor Exam:  ***  Reflexes are ***2+ and symmetric at the biceps, triceps, brachioradialis, patella and achilles.   Hoffman's is absent. Clonus is Absent  Bilateral upper and lower extremity sensation is intact to light touch ***.     Gait is normal.  ***   Medical Decision Making  Imaging: ***  Electrodiagnostics: ***  I have personally reviewed the images and electrodiagnostics and agree with the above interpretation.  Assessment and Plan: [name] is a pleasant 58 y.o. female with ***. The symptoms are causing a significant impact on the patient's life. I have utilized the care everywhere function in epic to review the outside records available from external health systems, and have personally reviewed relevant imaging and electrodiagnostic workup.    Thank you for involving me in the care of this patient.    Penne MICAEL Sharps MD/MSCR Neurosurgery - Peripheral Nerve Surgery

## 2024-09-01 ENCOUNTER — Ambulatory Visit: Admitting: Neurosurgery

## 2024-09-01 ENCOUNTER — Encounter: Payer: Self-pay | Admitting: Neurosurgery

## 2024-09-01 VITALS — BP 122/78 | Ht 67.0 in | Wt 174.2 lb

## 2024-09-01 DIAGNOSIS — G5731 Lesion of lateral popliteal nerve, right lower limb: Secondary | ICD-10-CM | POA: Insufficient documentation

## 2024-09-01 DIAGNOSIS — R202 Paresthesia of skin: Secondary | ICD-10-CM | POA: Diagnosis not present

## 2024-09-01 DIAGNOSIS — R200 Anesthesia of skin: Secondary | ICD-10-CM | POA: Insufficient documentation

## 2024-11-19 ENCOUNTER — Ambulatory Visit: Admitting: Neurosurgery
